# Patient Record
Sex: Female | Born: 1995 | Race: Black or African American | Hispanic: No | Marital: Single | Smoking: Never smoker
Health system: Southern US, Community
[De-identification: ages and names within clinical notes are randomized; demographics above are authoritative.]

## PROBLEM LIST (undated history)

## (undated) HISTORY — PX: TONSILLECTOMY: SUR1361

## (undated) HISTORY — PX: PLACEMENT OF BREAST IMPLANTS: SHX6334

## (undated) HISTORY — PX: DILATION AND CURETTAGE OF UTERUS: SHX78

## (undated) HISTORY — PX: INDUCED ABORTION: SHX677

## (undated) HISTORY — PX: APPENDECTOMY: SHX54

---
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
## 2017-05-25 IMAGING — US US PELVIS COMPLETE
1 series · 14 of 25 positions shown · non-contrast
Comparison: CT of the abdomen and pelvis from 09/16/2012

CLINICAL DATA: Acute onset of left pelvic pain.  Initial encounter.

EXAM:
TRANSABDOMINAL AND TRANSVAGINAL ULTRASOUND OF PELVIS
TECHNIQUE: Both transabdominal and transvaginal ultrasound examinations of the
pelvis were performed. Transabdominal technique was performed for
global imaging of the pelvis including uterus, ovaries, adnexal
regions, and pelvic cul-de-sac. It was necessary to proceed with
endovaginal exam following the transabdominal exam to visualize the
uterus and ovaries in greater detail.

[Series 1: us pelvis complete · 0.24mm/px · 14 of 61 slices shown]
[im 1/61]
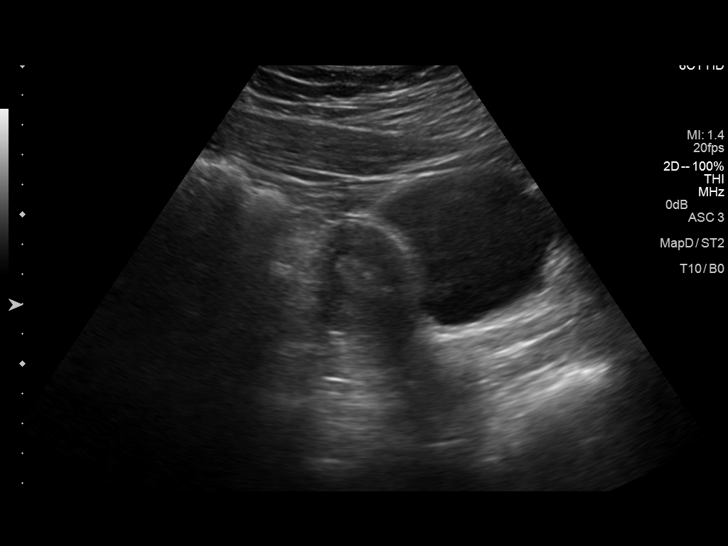
[im 6/61]
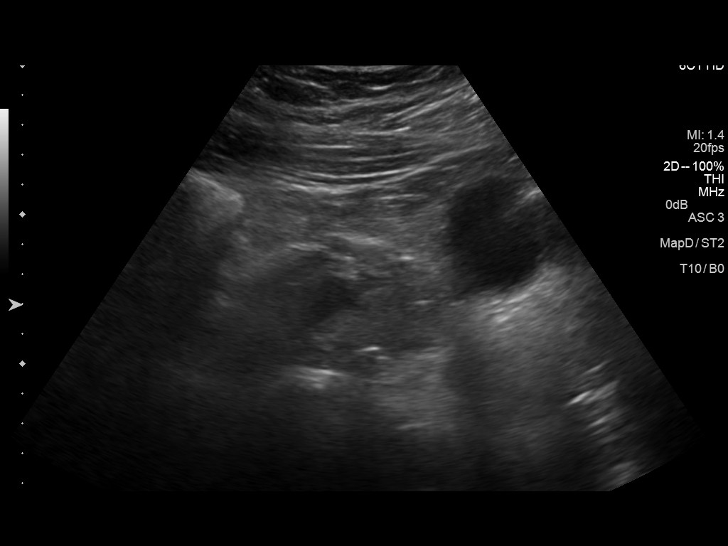
[im 11/61]
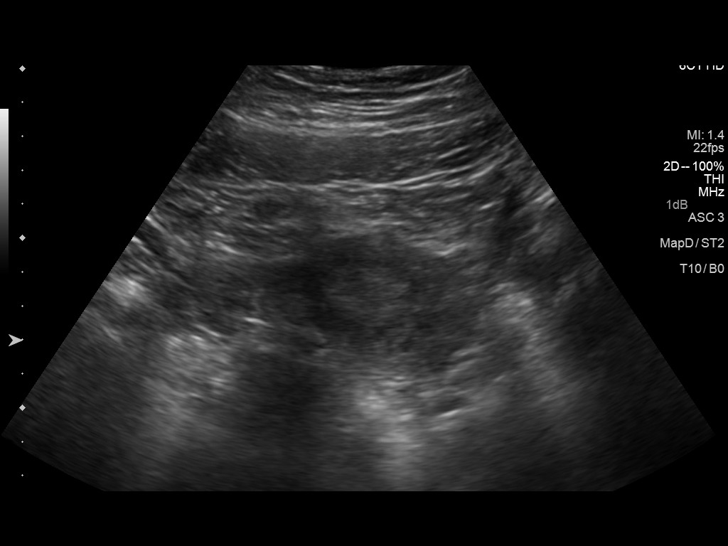
[im 16/61]
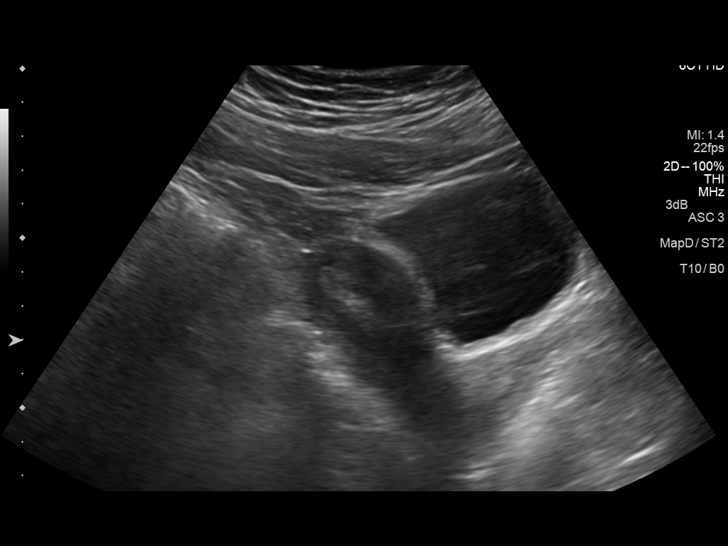
[im 21/61]
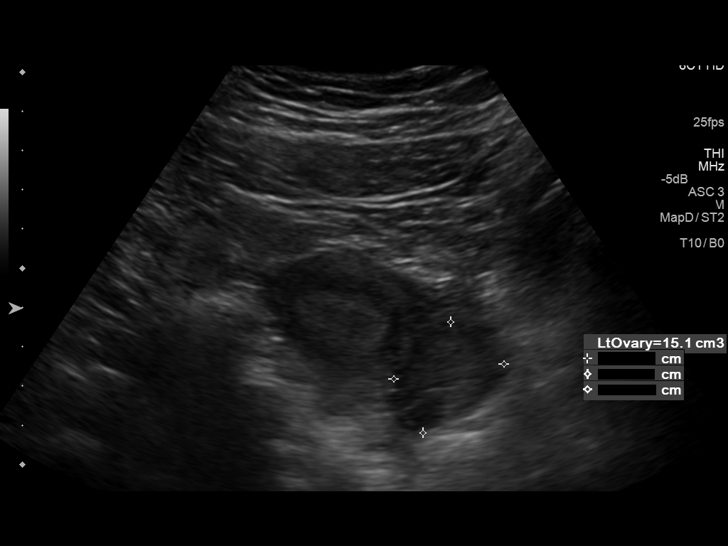
[im 23/61]
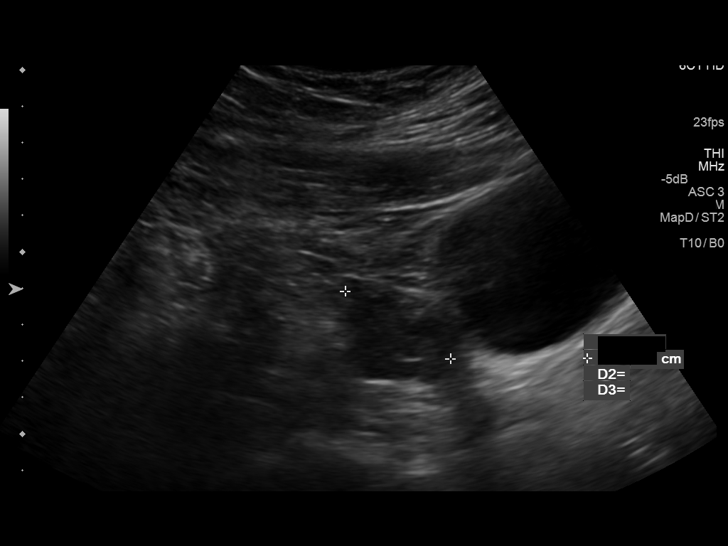
[im 28/61]
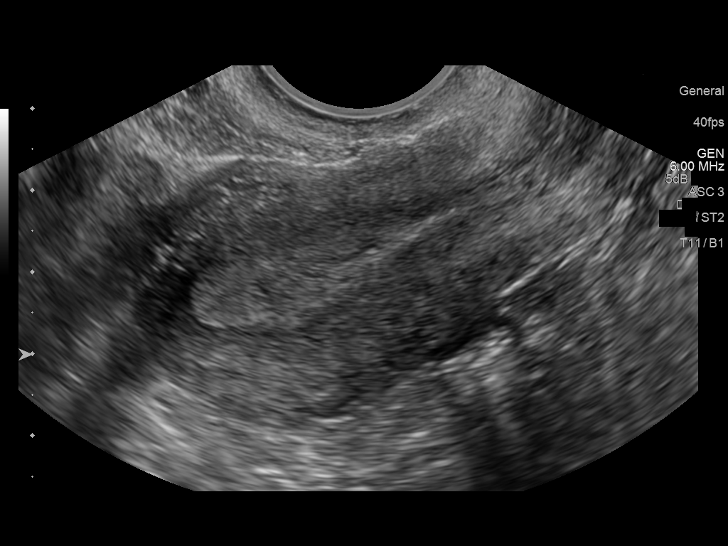
[im 33/61]
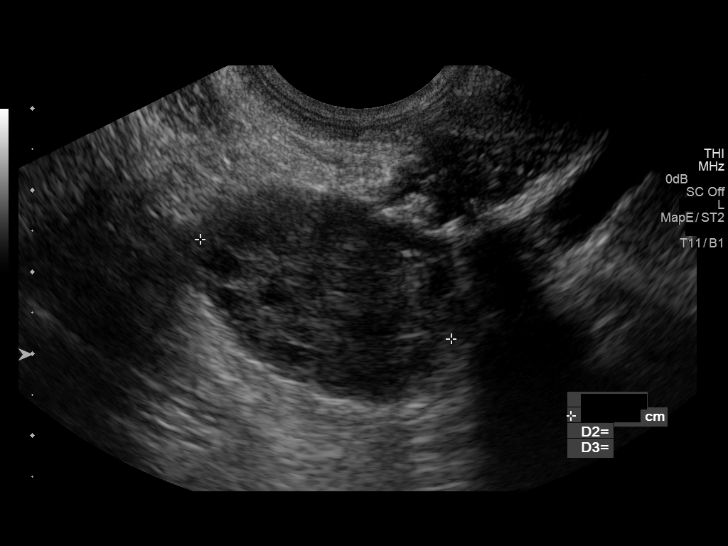
[im 38/61]
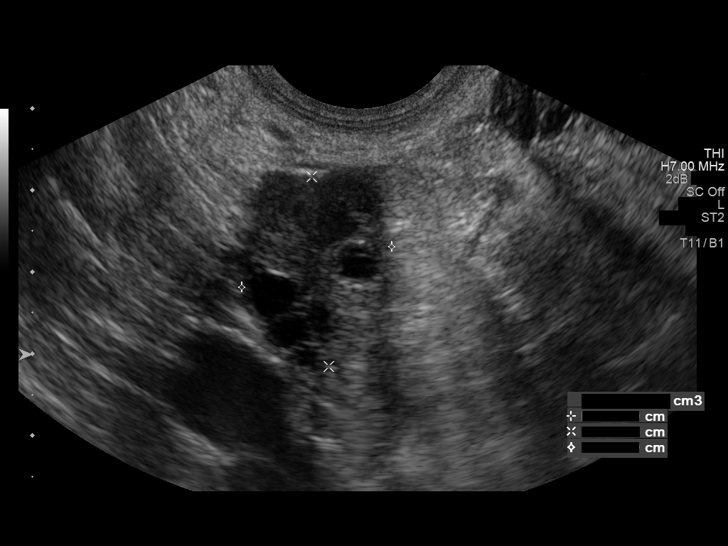
[im 41/61]
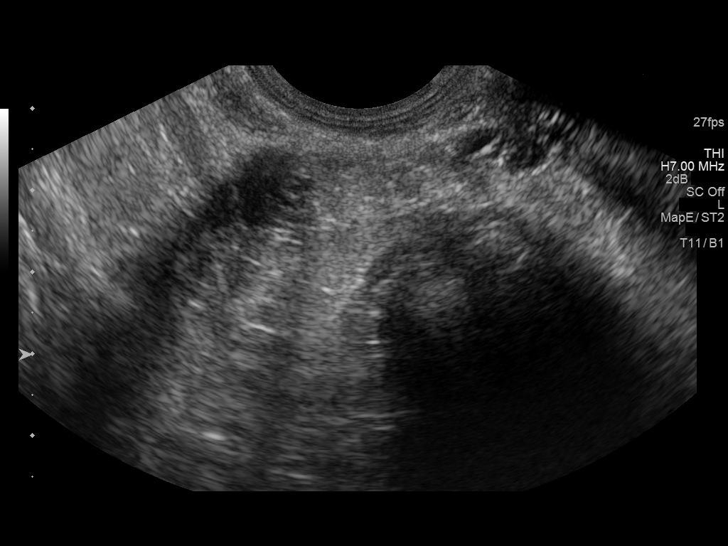
[im 46/61]
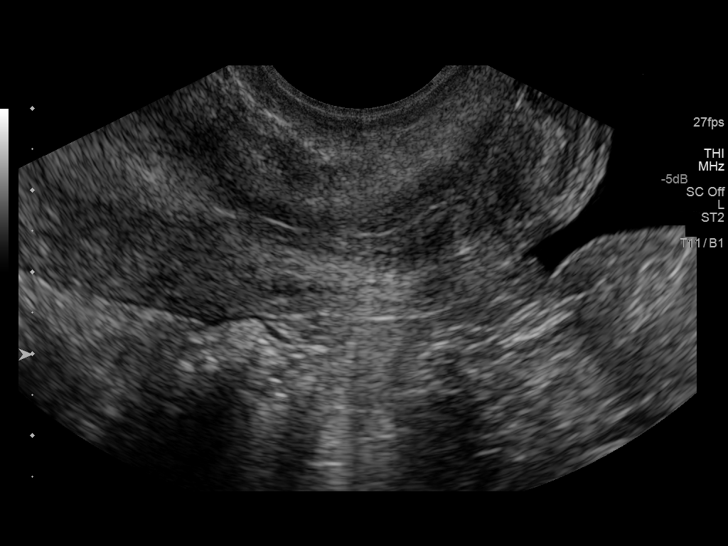
[im 51/61]
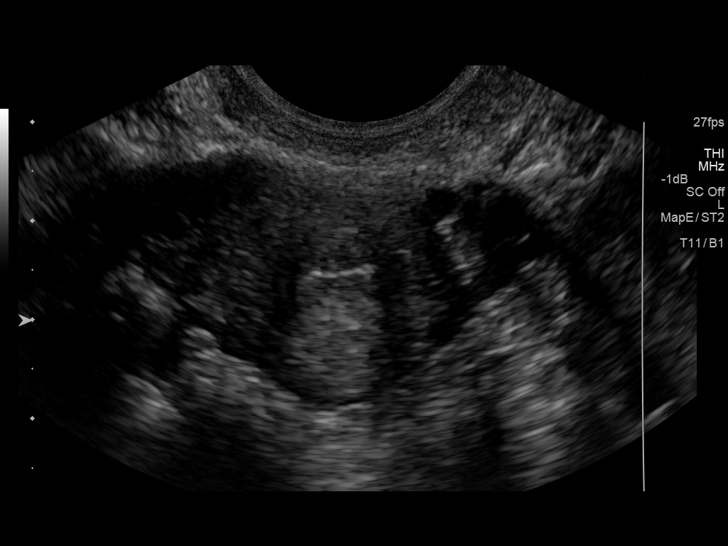
[im 56/61]
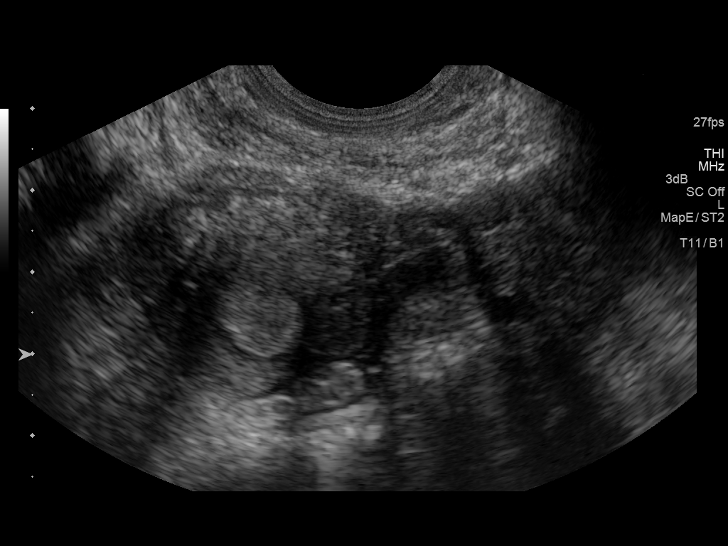
[im 61/61]
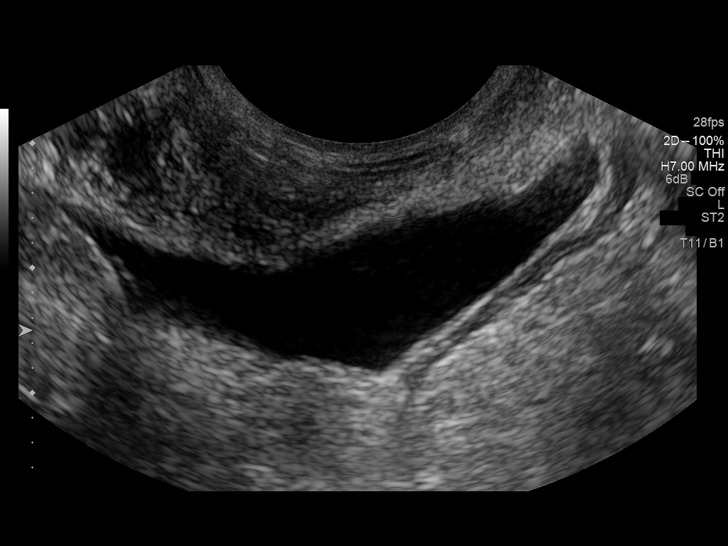

[14 of 25 positions shown; findings below may reference images not displayed]

FINDINGS: Uterus

Measurements: 6.2 x 3.4 x 4.2 cm. No fibroids or other mass
visualized.

Endometrium

Thickness: 1.3 cm.  No focal abnormality visualized.

Right ovary

Measurements: 3.4 x 2.3 x 2.7 cm. Normal appearance/no adnexal mass.

Left ovary

Measurements: 3.5 x 2.9 x 2.8 cm. Normal appearance/no adnexal mass.

Other findings

A small to moderate amount of free fluid is seen within the pelvic
cul-de-sac.
IMPRESSION: Unremarkable pelvic ultrasound.  No evidence for ovarian torsion.

## 2018-02-27 ENCOUNTER — Other Ambulatory Visit: Payer: Self-pay

## 2018-02-27 ENCOUNTER — Emergency Department
Admission: EM | Admit: 2018-02-27 | Discharge: 2018-02-28 | Disposition: A | Discharge status: Home / Self Care | Payer: Self-pay | Attending: Emergency Medicine | Admitting: Emergency Medicine

## 2018-02-27 ENCOUNTER — Encounter: Payer: Self-pay | Admitting: Emergency Medicine

## 2018-02-27 ENCOUNTER — Emergency Department: Payer: Self-pay

## 2018-02-27 DIAGNOSIS — S0512XA Contusion of eyeball and orbital tissues, left eye, initial encounter: Secondary | ICD-10-CM

## 2018-02-27 DIAGNOSIS — S0990XA Unspecified injury of head, initial encounter: Secondary | ICD-10-CM | POA: Insufficient documentation

## 2018-02-27 DIAGNOSIS — Y929 Unspecified place or not applicable: Secondary | ICD-10-CM | POA: Insufficient documentation

## 2018-02-27 DIAGNOSIS — S01112A Laceration without foreign body of left eyelid and periocular area, initial encounter: Secondary | ICD-10-CM | POA: Insufficient documentation

## 2018-02-27 DIAGNOSIS — W01198A Fall on same level from slipping, tripping and stumbling with subsequent striking against other object, initial encounter: Secondary | ICD-10-CM | POA: Insufficient documentation

## 2018-02-27 DIAGNOSIS — S0993XA Unspecified injury of face, initial encounter: Secondary | ICD-10-CM | POA: Insufficient documentation

## 2018-02-27 DIAGNOSIS — S0012XA Contusion of left eyelid and periocular area, initial encounter: Secondary | ICD-10-CM | POA: Insufficient documentation

## 2018-02-27 DIAGNOSIS — S0181XA Laceration without foreign body of other part of head, initial encounter: Secondary | ICD-10-CM

## 2018-02-27 DIAGNOSIS — Y9389 Activity, other specified: Secondary | ICD-10-CM | POA: Insufficient documentation

## 2018-02-27 DIAGNOSIS — Y998 Other external cause status: Secondary | ICD-10-CM | POA: Insufficient documentation

## 2018-02-27 MED ORDER — TRAMADOL HCL 50 MG PO TABS
50.0000 mg | ORAL_TABLET | Freq: Once | ORAL | Status: AC
  Administered 2018-02-28: 50 mg via ORAL
  Filled 2018-02-27: qty 1

## 2018-02-27 MED ORDER — BACITRACIN-NEOMYCIN-POLYMYXIN 400-5-5000 EX OINT
TOPICAL_OINTMENT | Freq: Once | CUTANEOUS | Status: AC
  Administered 2018-02-28: 1 via TOPICAL
  Filled 2018-02-27: qty 1

## 2018-02-27 MED ORDER — DIAZEPAM 5 MG PO TABS
5.0000 mg | ORAL_TABLET | Freq: Once | ORAL | Status: AC
  Administered 2018-02-27: 5 mg via ORAL
  Filled 2018-02-27: qty 1

## 2018-02-27 MED ORDER — LIDOCAINE-EPINEPHRINE (PF) 2 %-1:200000 IJ SOLN
30.0000 mL | Freq: Once | INTRAMUSCULAR | Status: DC
  Filled 2018-02-27: qty 30

## 2018-02-27 MED ORDER — TRAMADOL HCL 50 MG PO TABS: 50.0000 mg | ORAL_TABLET | Freq: Four times a day (QID) | ORAL | 0 refills | Status: AC | PRN

## 2018-02-27 MED ORDER — LIDOCAINE-EPINEPHRINE 2 %-1:100000 IJ SOLN
20.0000 mL | Freq: Once | INTRAMUSCULAR | Status: AC
  Administered 2018-02-27: 20 mL via INTRADERMAL

## 2018-02-27 NOTE — ED Notes (Signed)
Patient taken to imaging. 

## 2018-02-27 NOTE — Discharge Instructions (Addendum)
Do not get the sutured area wet for 24 hours. After 24 hours, shower/bathe as usual and pat the area dry. Change the bandage 2 times per day and apply antibiotic ointment. Leave open to air when at no risk of getting the area dirty, but cover at night before bed. See your PCP or go to Urgent Care in 5 days for suture removal or sooner for signs or concern of infection.  

## 2018-02-27 NOTE — ED Triage Notes (Signed)
Patient to ER for c/o facial lac to left eye brow. Patient states she tripped and fell, hitting hitch on truck. Approx 2 inch lac present to eye brow with mild breeding. Patient states she also knocked tooth back and busted lip.

## 2018-02-28 NOTE — ED Provider Notes (Signed)
[facility] Emergency Department Provider Note  ____________________________________________  Time seen: Approximately 12:03 AM  I have reviewed the triage vital signs and the nursing notes.   HISTORY  Chief Complaint Facial Laceration   HPI Erin White is a 23 y.o. female who presents to the emergency department for treatment and evaluation after slipping and falling. She landed face first on the hitch of the truck and now has a laceration to the left eyelid/forehead. She denies loss of consciousness, visual changes, nausea/vomiting, confusion, or headache. She did have a brief nosebleed on the left, but that stopped in just a couple of minutes. Tetanus is up to date.    History reviewed. No pertinent past medical history.  There are no active problems to display for this patient.   Past Surgical History:  Procedure Laterality Date  . DILATION AND CURETTAGE OF UTERUS    . INDUCED ABORTION     2//22/2020    Prior to Admission medications   Medication Sig Start Date End Date Taking? Authorizing Provider  traMADol (ULTRAM) 50 MG tablet Take 1 tablet (50 mg total) by mouth every 6 (six) hours as needed. 02/27/18   Chinita Pester, FNP    Allergies Patient has no known allergies.  No family history on file.  Social History Social History   Tobacco Use  . Smoking status: Never Smoker  . Smokeless tobacco: Never Used  Substance Use Topics  . Alcohol use: Not Currently  . Drug use: Not on file    Review of Systems  Constitutional: Negative for fever. Respiratory: Negative for cough or shortness of breath.  Musculoskeletal: Negative for myalgias Skin: Positive for laceration to the left eyelid/eyebrow. Neurological: Negative for numbness or paresthesias. ____________________________________________   PHYSICAL EXAM:  VITAL SIGNS: ED Triage Vitals  Enc Vitals Group     BP 02/27/18 2114 117/65     Pulse Rate 02/27/18 2114 87     Resp  02/27/18 2114 16     Temp 02/27/18 2114 98.6 F (37 C)     Temp Source 02/27/18 2114 Oral     SpO2 02/27/18 2114 100 %     Weight 02/27/18 2122 201 lb (91.2 kg)     Height 02/27/18 2122 4\' 10"  (1.473 m)     Head Circumference --      Peak Flow --      Pain Score 02/27/18 2122 0     Pain Loc --      Pain Edu? --      Excl. in GC? --      Constitutional: Uncomfortable appearing. Eyes: Conjunctivae are clear without discharge or drainage. No hyphema. Ecchymosis of the left eyelid present. Nose: No rhinorrhea noted. Mouth/Throat: Airway is patent. Left incisor somewhat loose, but feels stable. Neck: No stridor. Unrestricted range of motion observed. Cardiovascular: Capillary refill is <3 seconds.  Respiratory: Respirations are even and unlabored.. Musculoskeletal: Unrestricted range of motion observed. Neurologic: Awake, alert, and oriented x 4.  Skin:  5.5cm laceration to the left eyelid and brow area.  ____________________________________________   LABS (all labs ordered are listed, but only abnormal results are displayed)  Labs Reviewed - No data to display ____________________________________________  EKG  Not indicated. ____________________________________________  RADIOLOGY  Maxillofacial CT shows no acute facial bone fractures.  There is a nondisplaced fracture of the root of the left maxillary central incisor tooth. ____________________________________________   PROCEDURES  .Marland KitchenLaceration Repair Date/Time: 02/28/2018 12:17 AM Performed by: Chinita Pester, FNP Authorized  by: Chinita Pester, FNP   Consent:    Consent obtained:  Verbal   Consent given by:  Patient   Risks discussed:  Infection and poor cosmetic result Laceration details:    Location:  Face   Face location:  L upper eyelid   Extent:  Full thickness Repair type:    Repair type:  Complex Pre-procedure details:    Preparation:  Patient was prepped and draped in usual sterile  fashion Exploration:    Wound exploration: entire depth of wound probed and visualized     Wound extent: no foreign bodies/material noted, no muscle damage noted and no underlying fracture noted   Treatment:    Area cleansed with:  Betadine and saline   Irrigation solution:  Sterile saline   Irrigation method:  Syringe Subcutaneous repair:    Suture size:  5-0   Suture material:  Fast-absorbing gut   Suture technique:  Simple interrupted   Number of sutures:  4 Skin repair:    Repair method:  Sutures   Suture size:  6-0   Suture material:  Prolene   Suture technique:  Simple interrupted   Number of sutures:  10 Approximation:    Approximation:  Close Post-procedure details:    Dressing:  Antibiotic ointment   Patient tolerance of procedure:  Tolerated well, no immediate complications   ____________________________________________   INITIAL IMPRESSION / ASSESSMENT AND PLAN / ED COURSE  Erin White is a 23 y.o. female presents to the emergency department for treatment and evaluation after falling forward and striking her face on the trailer hitch of the truck.  She did not experience any loss of consciousness.  She states that initially she felt like her front tooth was loose, but states that it now feels like it is back in position.  CT does show damage to the nerve root and the patient was advised that she will need to follow-up with a dentist.  She states that she plans to call tomorrow for an appointment.  Laceration involving the left eyelid and the medial corner of the left eyebrow was reapproximated as described above.  Cosmetic result appeared to be satisfactory.  She was provided wound care instructions.  She was encouraged to have the sutures removed in 5 days and she states that she plans to see [facility] clinic for removal.  She was advised to use Mederma or vitamin E over the area to help with scarring.  She was encouraged to use sunscreen over the area to prevent  sunburn.   Patient was encouraged to return to the emergency department for symptoms of concern if she is unable to schedule an appointment with her primary care provider or see someone at [facility] clinic.   Medications  diazepam (VALIUM) tablet 5 mg (5 mg Oral Given 02/27/18 2223)  lidocaine-EPINEPHrine (XYLOCAINE W/EPI) 2 %-1:100000 (with pres) injection 20 mL (20 mLs Intradermal Given 02/27/18 2334)  neomycin-bacitracin-polymyxin (NEOSPORIN) ointment packet (1 application Topical Given 02/28/18 0004)  traMADol (ULTRAM) tablet 50 mg (50 mg Oral Given 02/28/18 0000)     Pertinent labs & imaging results that were available during my care of the patient were reviewed by me and considered in my medical decision making (see chart for details).  ____________________________________________   FINAL CLINICAL IMPRESSION(S) / ED DIAGNOSES  Final diagnoses:  Facial laceration, initial encounter  Injury of head, initial encounter  Eye contusion, left, initial encounter  Dental trauma, initial encounter    ED Discharge Orders  Ordered    traMADol (ULTRAM) 50 MG tablet  Every 6 hours PRN     02/27/18 2357           Note:  This document was prepared using Dragon voice recognition software and may include unintentional dictation errors.    Chinita Pester, FNP 02/28/18 0026    Jeanmarie Plant, MD 03/02/18 [phone]

## 2019-11-08 ENCOUNTER — Ambulatory Visit: Payer: Self-pay

## 2019-11-08 ENCOUNTER — Ambulatory Visit (LOCAL_COMMUNITY_HEALTH_CENTER): Payer: BLUE CROSS/BLUE SHIELD | Admitting: Advanced Practice Midwife

## 2019-11-08 ENCOUNTER — Encounter: Payer: Self-pay | Admitting: Advanced Practice Midwife

## 2019-11-08 ENCOUNTER — Other Ambulatory Visit: Payer: Self-pay

## 2019-11-08 VITALS — BP 115/66 | Ht <= 58 in | Wt 235.0 lb

## 2019-11-08 DIAGNOSIS — Z3201 Encounter for pregnancy test, result positive: Secondary | ICD-10-CM

## 2019-11-08 DIAGNOSIS — Z3200 Encounter for pregnancy test, result unknown: Secondary | ICD-10-CM

## 2019-11-08 DIAGNOSIS — Z3009 Encounter for other general counseling and advice on contraception: Secondary | ICD-10-CM

## 2019-11-08 LAB — PREGNANCY, URINE: Preg Test, Ur: POSITIVE — AB

## 2019-11-08 MED ORDER — PRENATAL VITAMIN 27-0.8 MG PO TABS: 1.0000 | ORAL_TABLET | Freq: Every day | ORAL | 0 refills | Status: AC

## 2019-11-08 NOTE — Progress Notes (Signed)
Pt with positive UPT. Positive UPT packet, immunization record, proof of pregnancy, and PNV's given to pt. Pt reports no issues with pregnancy so far. Pt states unsure of where she would like to go for prenatal care and declines pre-admit today. Pt aware of WIC services and prenatal medicaid availability if needed.

## 2019-12-09 ENCOUNTER — Other Ambulatory Visit: Payer: Self-pay

## 2019-12-09 ENCOUNTER — Ambulatory Visit: Payer: BLUE CROSS/BLUE SHIELD | Admitting: Obstetrics

## 2019-12-09 ENCOUNTER — Encounter: Payer: Self-pay | Admitting: Obstetrics

## 2019-12-09 VITALS — BP 108/70 | Wt 234.0 lb

## 2019-12-09 DIAGNOSIS — Z3480 Encounter for supervision of other normal pregnancy, unspecified trimester: Secondary | ICD-10-CM | POA: Insufficient documentation

## 2019-12-09 NOTE — Progress Notes (Unsigned)
C/O [email or web address] 

## 2020-06-22 DIAGNOSIS — Z98891 History of uterine scar from previous surgery: Secondary | ICD-10-CM

## 2020-06-22 HISTORY — DX: History of uterine scar from previous surgery: Z98.891

## 2020-07-06 ENCOUNTER — Ambulatory Visit
Admission: EM | Admit: 2020-07-06 | Discharge: 2020-07-06 | Disposition: A | Discharge status: Home / Self Care | Payer: Medicaid Other | Attending: Sports Medicine | Admitting: Sports Medicine

## 2020-07-06 ENCOUNTER — Other Ambulatory Visit: Payer: Self-pay

## 2020-07-06 DIAGNOSIS — O900 Disruption of cesarean delivery wound: Secondary | ICD-10-CM | POA: Diagnosis not present

## 2020-07-06 DIAGNOSIS — R509 Fever, unspecified: Secondary | ICD-10-CM

## 2020-07-06 MED ORDER — AMOXICILLIN-POT CLAVULANATE 875-125 MG PO TABS: 1.0000 | ORAL_TABLET | Freq: Two times a day (BID) | ORAL | 0 refills | Status: DC

## 2020-07-06 NOTE — Discharge Instructions (Addendum)
As we discussed, your incision looks great except for a small opening on the left side of your abdomen.  It is draining normal serosanguineous fluid.  There is no abscess or active infection.  Your abdomen is soft. However, given that you are having chills and your current temperature is 100.5, I am electing to treat you with antibiotics.  Please take as directed. I want you to call your OB when you leave here.  You will get the doctor on-call.  I want you to advise them of your symptoms, and that you were seen in the urgent care.  Let them know that you had a temperature of 100.5 and that they put you on antibiotics.  Also, let them know that the doctor would like you to be seen by OB on Tuesday, July 5 for a follow-up appointment. If your symptoms worsen between your follow-up appointment and now, I want you to go to the emergency room and I recommend [facility] as you gave birth in that hospital and would assist with continuity of care. Please see educational handouts. Use Tylenol only as you are breast-feeding - for any fever or discomfort.

## 2020-07-06 NOTE — ED Provider Notes (Signed)
[facility]    CSN: [id number] Arrival date & time: 07/06/20  1531      History   Chief Complaint Chief Complaint  Patient presents with   Incisional Pain    C-section 06/22/20    HPI Erin White is a 25 y.o. female.   Patient is a 25 year old female who presents for evaluation of concern about discharge from an abdominal incision from her recent cesarean section.  She gave birth on June 22, 2020.  She is 14 days post cesarean section.  Baby is doing fine.  [facility].  I have reviewed her medical record in detail.  They have attempted to contact her on 2 occasions and have sent a letter regarding her follow-up.  According to the medical record they were unable to make any contact with her so far.  She did not call them prior to coming to the urgent care.  Her OB/GYN is Dr. Alben SpittleWeaver.  She has a family friend that she reports is her midwife that has been looking at her wound.  There was concern about possible infection.  They have been taping the wound.  It has been oozing a yellowish substance.  She reports no documented fevers although she has had some chills.  She denies any abdominal pain or changes in urinary or bowel habits.  No dysuria or hematuria.  No nausea vomiting or diarrhea.   Past Medical History:  Diagnosis Date   History of C-section 06/22/2020    Patient Active Problem List   Diagnosis Date Noted   Supervision of other normal pregnancy, antepartum 12/09/2019    Past Surgical History:  Procedure Laterality Date   APPENDECTOMY     DILATION AND CURETTAGE OF UTERUS     INDUCED ABORTION     2//22/2020   PLACEMENT OF BREAST IMPLANTS     left side, ~2016 per pt   TONSILLECTOMY      OB History     Gravida  2   Para      Term      Preterm      AB  1   Living         SAB      IAB      Ectopic      Multiple      Live Births               Home Medications    Prior to Admission  medications   Medication Sig Start Date End Date Taking? Authorizing Provider  amoxicillin-clavulanate (AUGMENTIN) 875-125 MG tablet Take 1 tablet by mouth every 12 (twelve) hours. 07/06/20  Yes Delton SeeBarnes, Karlton Maya, MD  Prenatal Vit-Fe Fumarate-FA (PRENATAL VITAMIN) 27-0.8 MG TABS Take 1 tablet by mouth daily. 11/08/19  Yes Sciora, Austin MilesElizabeth A, CNM  traMADol (ULTRAM) 50 MG tablet Take 1 tablet (50 mg total) by mouth every 6 (six) hours as needed. Patient not taking: Reported on 11/08/2019 02/27/18   Chinita Pesterriplett, Cari B, FNP    Family History Family History  Problem Relation Age of Onset   Diabetes Maternal Aunt    Hypertension Maternal Aunt    Breast cancer Maternal Aunt    Hypertension Paternal Uncle     Social History Social History   Tobacco Use   Smoking status: Never   Smokeless tobacco: Never  Vaping Use   Vaping Use: Never used  Substance Use Topics   Alcohol use: Not Currently   Drug  use: Never     Allergies   Other   Review of Systems Review of Systems  Constitutional:  Positive for chills and fever. Negative for activity change, appetite change, diaphoresis and fatigue.  HENT:  Negative for congestion, ear pain, postnasal drip, rhinorrhea, sinus pressure, sinus pain, sneezing and sore throat.   Eyes:  Negative for pain.  Respiratory:  Negative for cough, chest tightness and shortness of breath.   Cardiovascular:  Negative for chest pain and palpitations.  Gastrointestinal:  Negative for abdominal pain, diarrhea, nausea and vomiting.  Genitourinary:  Negative for dysuria, flank pain, hematuria, pelvic pain, urgency, vaginal bleeding, vaginal discharge and vaginal pain.  Musculoskeletal:  Negative for back pain, myalgias and neck pain.  Skin:  Positive for wound. Negative for color change, pallor and rash.  Neurological:  Negative for dizziness, light-headedness and headaches.  All other systems reviewed and are negative.   Physical Exam Triage Vital Signs ED Triage  Vitals [07/06/20 1559]  Enc Vitals Group     BP      Pulse      Resp      Temp      Temp src      SpO2      Weight      Height      Head Circumference      Peak Flow      Pain Score 4     Pain Loc      Pain Edu?      Excl. in [place]?    No data found.  Updated Vital Signs BP 115/76 (BP Location: Left Arm)   Pulse (!) 109   Temp (!) 100.5 F (38.1 C) (Oral)   Resp 18   Ht 4\' 10"  (1.473 m)   Wt 81.6 kg   LMP 09/14/2019 (Exact Date) Comment: normal period per pt  SpO2 100%   Breastfeeding Yes   BMI 37.62 kg/m   Visual Acuity Right Eye Distance:   Left Eye Distance:   Bilateral Distance:    Right Eye Near:   Left Eye Near:    Bilateral Near:     Physical Exam Vitals and nursing note reviewed.  Constitutional:      General: She is not in acute distress.    Appearance: Normal appearance. She is not ill-appearing, toxic-appearing or diaphoretic.  HENT:     Head: Normocephalic and atraumatic.     Nose: Nose normal.     Mouth/Throat:     Mouth: Mucous membranes are moist.  Eyes:     General: No scleral icterus.    Conjunctiva/sclera: Conjunctivae normal.     Pupils: Pupils are equal, round, and reactive to light.  Cardiovascular:     Rate and Rhythm: Normal rate and regular rhythm.     Pulses: Normal pulses.     Heart sounds: Normal heart sounds. No murmur heard.   No friction rub. No gallop.  Pulmonary:     Effort: Pulmonary effort is normal.     Breath sounds: Normal breath sounds. No stridor. No wheezing, rhonchi or rales.  Abdominal:     General: Abdomen is protuberant. A surgical scar is present. Bowel sounds are normal. There are no signs of injury.     Palpations: Abdomen is soft. There is no shifting dullness, fluid wave, hepatomegaly, splenomegaly or mass.     Tenderness: There is no abdominal tenderness. There is no right CVA tenderness, left CVA tenderness, guarding or rebound.     Comments: Tiny  opening on the left side of her abdomen at the end of  the surgical incision.  It is draining some serosanguineous fluid.  There is no pus drainage.  No real tenderness around the area.  No erythema.  No active infection.  The abdomen is soft and nontender.  Musculoskeletal:     Cervical back: Normal range of motion and neck supple.  Skin:    General: Skin is warm and dry.     Capillary Refill: Capillary refill takes less than 2 seconds.  Neurological:     General: No focal deficit present.     Mental Status: She is alert and oriented to person, place, and time.     UC Treatments / Results  Labs (all labs ordered are listed, but only abnormal results are displayed) Labs Reviewed - No data to display  EKG   Radiology No results found.  Procedures Procedures (including critical care time)  Medications Ordered in UC Medications - No data to display  Initial Impression / Assessment and Plan / UC Course  I have reviewed the triage vital signs and the nursing notes.  Pertinent labs & imaging results that were available during my care of the patient were reviewed by me and considered in my medical decision making (see chart for details).  Clinical impression: 1.  Tiny wound dehiscence on the left side of a cesarean section incision that is 37 weeks old. 2.  Febrile illness with current temperature of 100.5 with associated chills. 3.  Benign abdomen without abscess formation.  Treatment plan: 1.  The findings and treatment plan were discussed in detail with the patient.  Patient was in agreement. 2.  Had a long discussion with the patient and reassured her that there was no active infection or abscess formation.  I advised her that she needed to contact her OB/GYN as soon as she leaves here and update them on the situation.  I did indicate to her that I had concerned because her current temperature was 100.5 so I elected to treat her with antibiotics.  Side effects of the antibiotic were explained in detail. 3.  Educational handouts  provided. 4.  I advised her to get into see her OB/GYN tomorrow July 5 or the next day July 6. 5.  If symptoms persist or worsen I told her that she needed to go to [facility] emergency department and she voiced verbal understanding. 6.  Tylenol for fever or discomfort.  She is breast-feeding so it is Tylenol only. 7.  Advised of the side effects of Augmentin with the baby in the breast-feeding.  She reports the baby is constipated so she is not too concerned about the diarrhea side effects. 8.  She was discharged in stable condition and will follow-up here as needed.    Final Clinical Impressions(s) / UC Diagnoses   Final diagnoses:  Wound dehiscence, cesarean, postpartum condition  Febrile illness     Discharge Instructions      As we discussed, your incision looks great except for a small opening on the left side of your abdomen.  It is draining normal serosanguineous fluid.  There is no abscess or active infection.  Your abdomen is soft. However, given that you are having chills and your current temperature is 100.5, I am electing to treat you with antibiotics.  Please take as directed. I want you to call your OB when you leave here.  You will get the doctor on-call.  I want you to advise  them of your symptoms, and that you were seen in the urgent care.  Let them know that you had a temperature of 100.5 and that they put you on antibiotics.  Also, let them know that the doctor would like you to be seen by OB on Tuesday, July 5 for a follow-up appointment. If your symptoms worsen between your follow-up appointment and now, I want you to go to the emergency room and I recommend [facility] as you gave birth in that hospital and would assist with continuity of care. Please see educational handouts. Use Tylenol only as you are breast-feeding - for any fever or discomfort.     ED Prescriptions     Medication Sig Dispense Auth. Provider   amoxicillin-clavulanate (AUGMENTIN)  875-125 MG tablet Take 1 tablet by mouth every 12 (twelve) hours. 14 tablet Delton See, MD      PDMP not reviewed this encounter.   Delton See, MD 07/06/20 1630

## 2020-07-06 NOTE — ED Triage Notes (Signed)
Pt c/o recent c-section and is concerned about possible infection or complication. Pt states area was taped together and she removed this last week herself. Pt reports there has been an open area that has been oozing a light yellow substance. Pt states the area does seem to be closing more now. Pt denies f/n/v/d or severe abd pain.

## 2021-01-23 IMAGING — CT CT MAXILLOFACIAL W/O CM
3 series · 16 of 47 positions shown, 19 images · non-contrast
Comparison: None.

CLINICAL DATA: 22-year-old female with maxillofacial trauma.

EXAM:
CT MAXILLOFACIAL WITHOUT CONTRAST
TECHNIQUE: Multidetector CT imaging of the maxillofacial structures was
performed. Multiplanar CT image reconstructions were also generated.

[Series 2: max soft · axial · 0.31mm/px · z∈[+255,+381]mm · 10 of 73 slices shown, 13 images]
[im 5/73  brain]
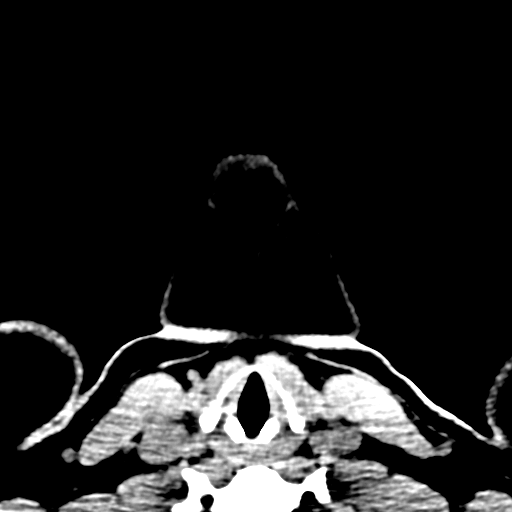
[im 5/73  bone]
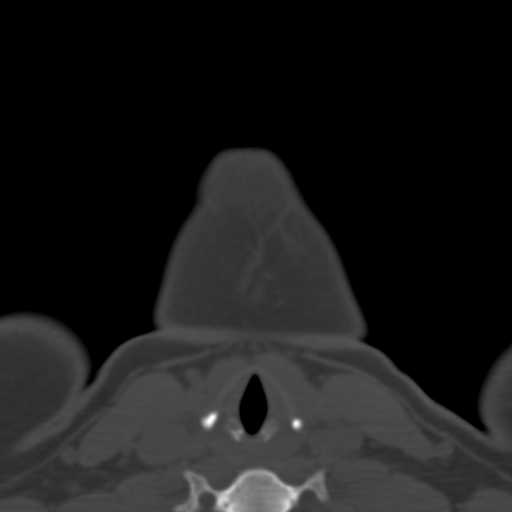
[im 13/73  bone]
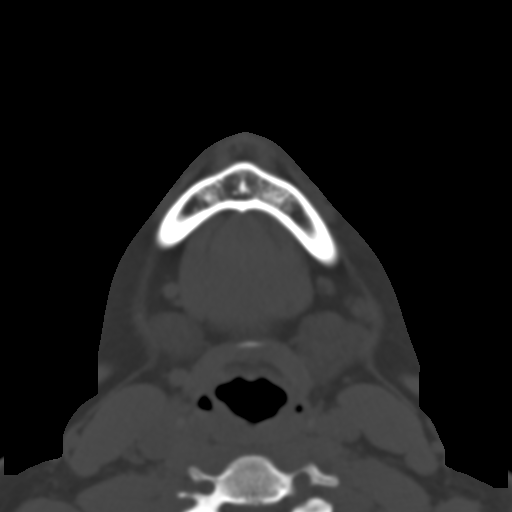
[im 20/73  bone]
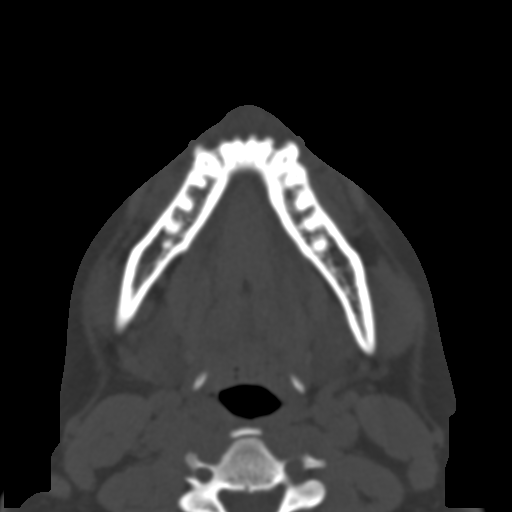
[im 25/73  bone]
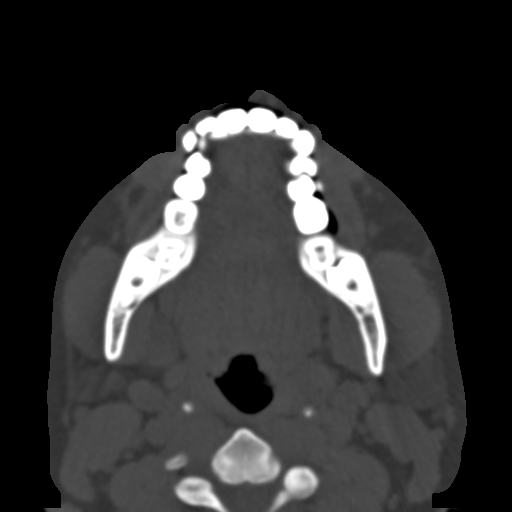
[im 33/73  brain]
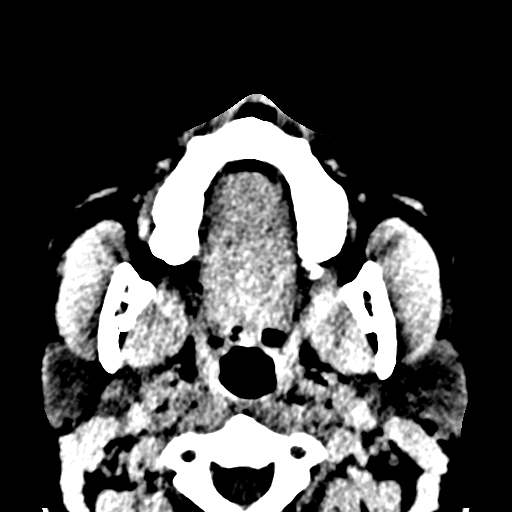
[im 33/73  bone]
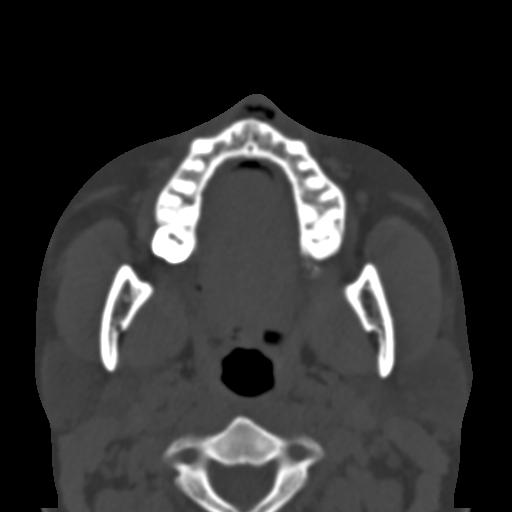
[im 40/73  bone]
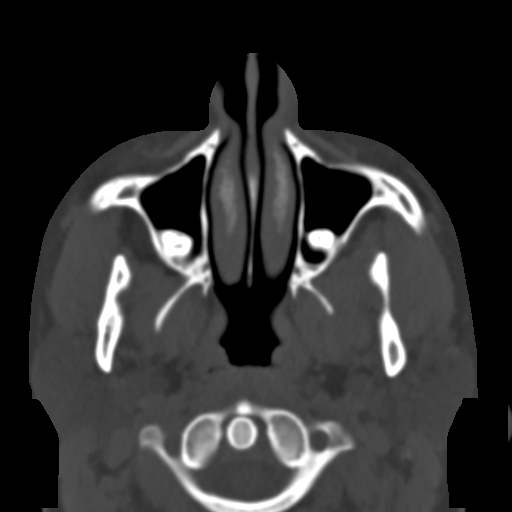
[im 48/73  bone]
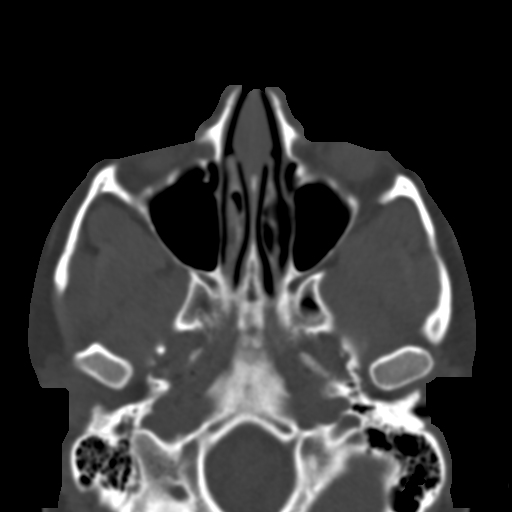
[im 55/73  bone]
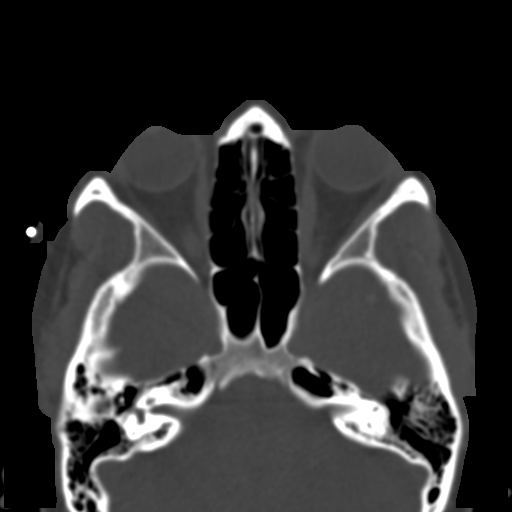
[im 60/73  brain]
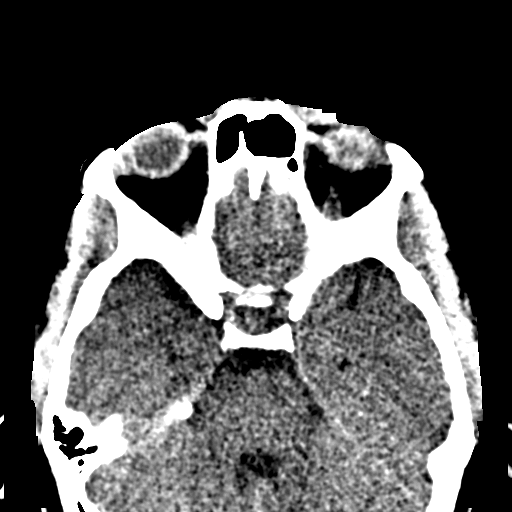
[im 60/73  bone]
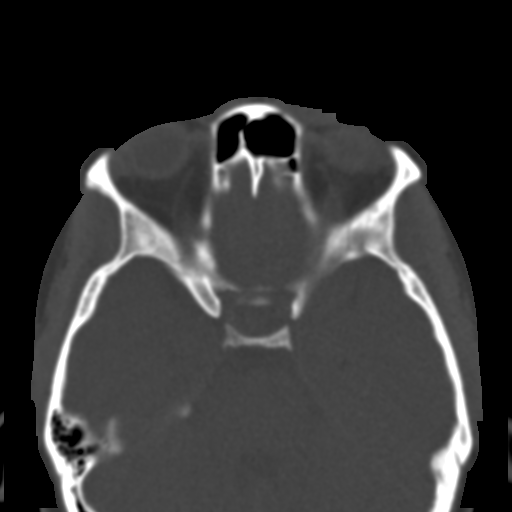
[im 68/73  bone]
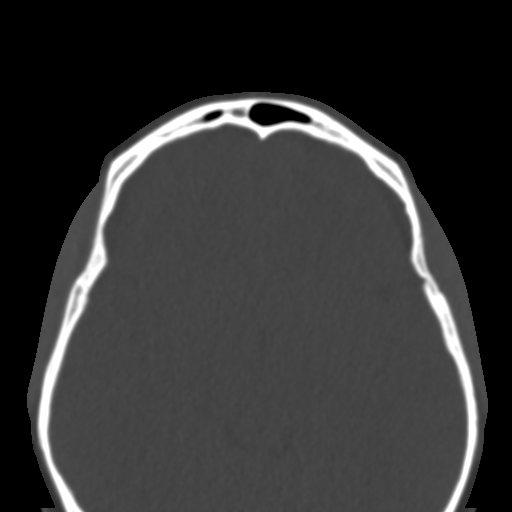

[Series 6: coronal soft · coronal · 0.31mm/px · 3 of 61 slices shown]
[im 21/61  bone]
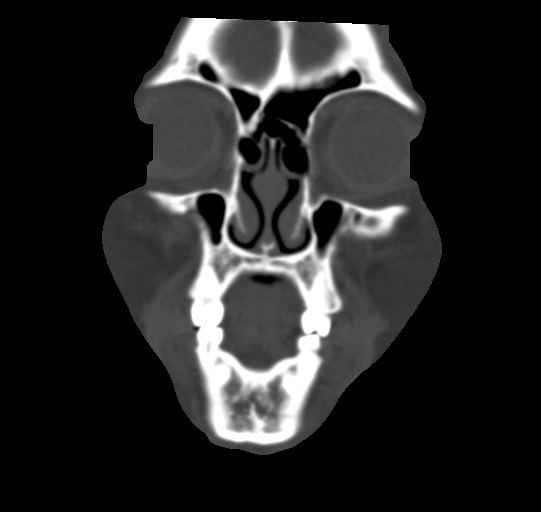
[im 27/61  bone]
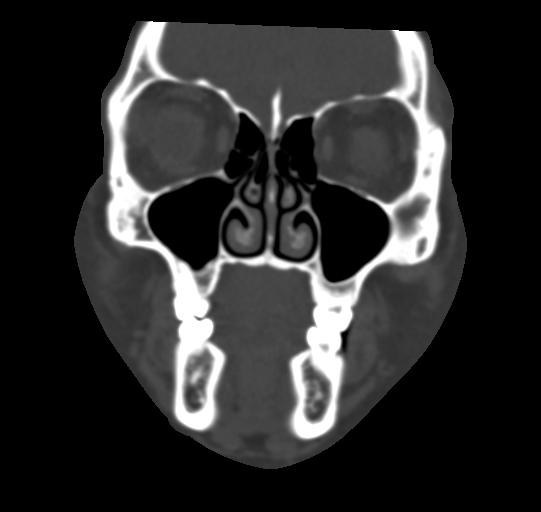
[im 34/61  bone]
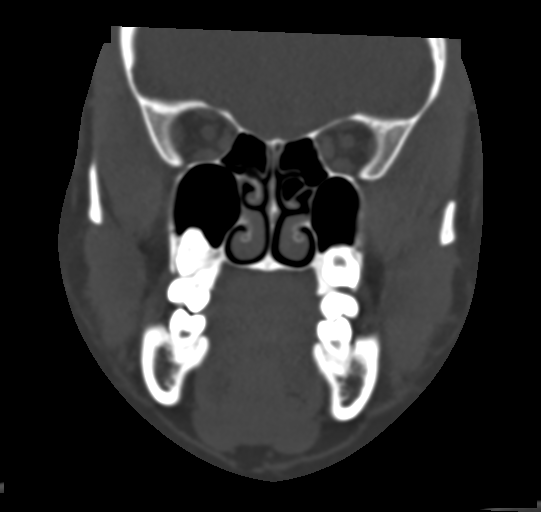

[Series 7: sagittal soft · sagittal · 0.29mm/px · 3 of 78 slices shown]
[im 26/78  bone]
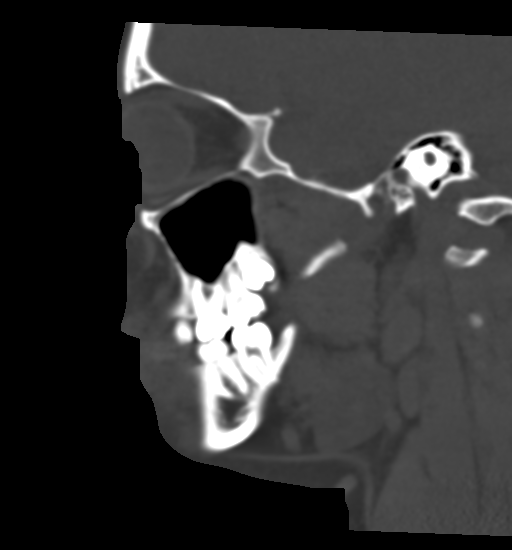
[im 39/78  bone]
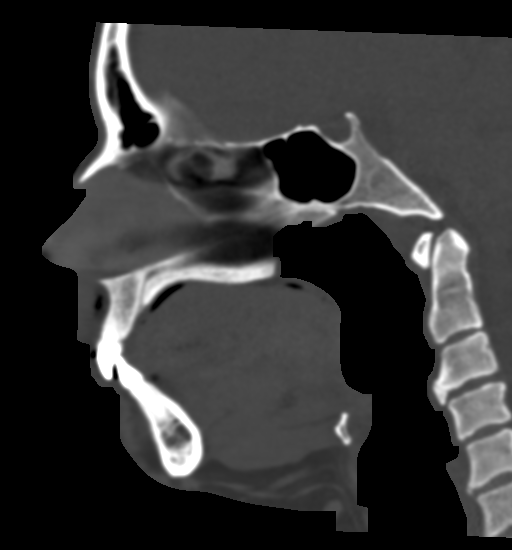
[im 52/78  bone]
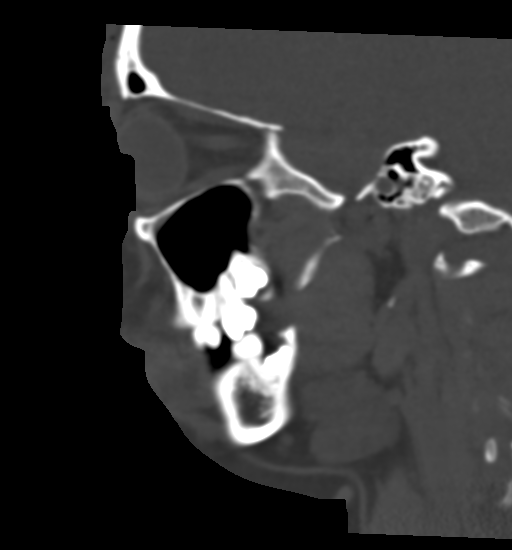

[16 of 47 positions shown; findings below may reference images not displayed]

FINDINGS: Osseous: No acute facial bone fractures. No mandibular subluxation.
There is a nondisplaced fracture of the root of the left maxillary
central incisor tooth.

Orbits: Negative. No traumatic or inflammatory finding.

Sinuses: Clear.

Soft tissues: Left forehead laceration and small hematoma.

Limited intracranial: No significant or unexpected finding.
IMPRESSION: 1. No acute facial bone fractures.
2. Nondisplaced fracture of the root of the left maxillary central
incisor tooth.
3. Left forehead laceration and small hematoma.

## 2021-03-02 ENCOUNTER — Ambulatory Visit
Admission: EM | Admit: 2021-03-02 | Discharge: 2021-03-02 | Disposition: A | Discharge status: Home / Self Care | Payer: Medicaid Other | Attending: Physician Assistant | Admitting: Physician Assistant

## 2021-03-02 ENCOUNTER — Other Ambulatory Visit: Payer: Self-pay

## 2021-03-02 DIAGNOSIS — R0981 Nasal congestion: Secondary | ICD-10-CM

## 2021-03-02 DIAGNOSIS — R051 Acute cough: Secondary | ICD-10-CM

## 2021-03-02 DIAGNOSIS — J069 Acute upper respiratory infection, unspecified: Secondary | ICD-10-CM

## 2021-03-02 MED ORDER — PROMETHAZINE-DM 6.25-15 MG/5ML PO SYRP: 5.0000 mL | ORAL_SOLUTION | Freq: Four times a day (QID) | ORAL | 0 refills | Status: AC | PRN

## 2021-03-02 MED ORDER — BENZONATATE 200 MG PO CAPS: 200.0000 mg | ORAL_CAPSULE | Freq: Three times a day (TID) | ORAL | 0 refills | Status: AC | PRN

## 2021-03-02 NOTE — ED Provider Notes (Addendum)
[facility]    CSN: [id number] Arrival date & time: 03/02/21  1057      History   Chief Complaint Chief Complaint  Patient presents with   Cough   Nasal Congestion    HPI Erin White is a 26 y.o. female presenting for about 1 week history of cough and congestion.  Patient reports temps up to 102 degrees 3 days ago.  No fever in the past 3 days.  She has been taking TheraFlu.  She says she brought her son and on 02/23/2020 for evaluation and he was found to have viral illness.  She believes she may have the same thing.  He was negative for COVID and flu.  Patient reports despite taking cough medication she is not getting any better and she feels like she is getting worse.  Does not report any breathing trouble, vomiting or diarrhea.  Patient does report having a baby recently.  She says the baby is formula fed and she is not resting.  No other complaints.  HPI  Past Medical History:  Diagnosis Date   History of C-section 06/22/2020    Patient Active Problem List   Diagnosis Date Noted   Supervision of other normal pregnancy, antepartum 12/09/2019    Past Surgical History:  Procedure Laterality Date   APPENDECTOMY     DILATION AND CURETTAGE OF UTERUS     INDUCED ABORTION     2//22/2020   PLACEMENT OF BREAST IMPLANTS     left side, ~2016 per pt   TONSILLECTOMY      OB History     Gravida  2   Para      Term      Preterm      AB  1   Living         SAB      IAB      Ectopic      Multiple      Live Births               Home Medications    Prior to Admission medications   Medication Sig Start Date End Date Taking? Authorizing Provider  benzonatate (TESSALON) 200 MG capsule Take 1 capsule (200 mg total) by mouth 3 (three) times daily as needed for cough. 03/02/21  Yes Danton Clap, PA-C  promethazine-dextromethorphan (PROMETHAZINE-DM) 6.25-15 MG/5ML syrup Take 5 mLs by mouth 4 (four) times daily as needed. 03/02/21  Yes Danton Clap, PA-C  Prenatal Vit-Fe Fumarate-FA (PRENATAL VITAMIN) 27-0.8 MG TABS Take 1 tablet by mouth daily. 11/08/19   Sciora, Real Cons, CNM  traMADol (ULTRAM) 50 MG tablet Take 1 tablet (50 mg total) by mouth every 6 (six) hours as needed. Patient not taking: Reported on 11/08/2019 02/27/18   Victorino Dike, FNP    Family History Family History  Problem Relation Age of Onset   Diabetes Maternal Aunt    Hypertension Maternal Aunt    Breast cancer Maternal Aunt    Hypertension Paternal Uncle     Social History Social History   Tobacco Use   Smoking status: Never   Smokeless tobacco: Never  Vaping Use   Vaping Use: Never used  Substance Use Topics   Alcohol use: Not Currently   Drug use: Never     Allergies   Other   Review of Systems Review of Systems  Constitutional:  Negative for chills, diaphoresis, fatigue and fever.  HENT:  Positive for congestion and rhinorrhea. Negative  for ear pain, sinus pressure, sinus pain and sore throat.   Respiratory:  Positive for cough. Negative for shortness of breath.   Gastrointestinal:  Negative for abdominal pain, nausea and vomiting.  Musculoskeletal:  Negative for arthralgias and myalgias.  Skin:  Negative for rash.  Neurological:  Negative for weakness and headaches.  Hematological:  Negative for adenopathy.    Physical Exam Triage Vital Signs ED Triage Vitals  Enc Vitals Group     BP 03/02/21 1146 108/74     Pulse Rate 03/02/21 1146 87     Resp 03/02/21 1146 18     Temp 03/02/21 1146 98.8 F (37.1 C)     Temp Source 03/02/21 1146 Oral     SpO2 03/02/21 1146 96 %     Weight 03/02/21 1143 230 lb (104.3 kg)     Height 03/02/21 1143 4\' 10"  (1.473 m)     Head Circumference --      Peak Flow --      Pain Score 03/02/21 1143 3     Pain Loc --      Pain Edu? --      Excl. in [place]? --    No data found.  Updated Vital Signs BP 108/74 (BP Location: Left Arm)    Pulse 87    Temp 98.8 F (37.1 C) (Oral)    Resp 18    Ht  4\' 10"  (1.473 m)    Wt 230 lb (104.3 kg)    LMP 02/12/2021    SpO2 96%    BMI 48.07 kg/m      Physical Exam Vitals and nursing note reviewed.  Constitutional:      General: She is not in acute distress.    Appearance: Normal appearance. She is ill-appearing. She is not toxic-appearing.  HENT:     Head: Normocephalic and atraumatic.     Nose: Congestion present.     Mouth/Throat:     Mouth: Mucous membranes are moist.     Pharynx: Oropharynx is clear.  Eyes:     General: No scleral icterus.       Right eye: No discharge.        Left eye: No discharge.     Conjunctiva/sclera: Conjunctivae normal.  Cardiovascular:     Rate and Rhythm: Normal rate and regular rhythm.     Heart sounds: Normal heart sounds.  Pulmonary:     Effort: Pulmonary effort is normal. No respiratory distress.     Breath sounds: Normal breath sounds. No wheezing, rhonchi or rales.  Musculoskeletal:     Cervical back: Neck supple.  Skin:    General: Skin is dry.  Neurological:     General: No focal deficit present.     Mental Status: She is alert. Mental status is at baseline.     Motor: No weakness.     Gait: Gait normal.  Psychiatric:        Mood and Affect: Mood normal.        Behavior: Behavior normal.        Thought Content: Thought content normal.     UC Treatments / Results  Labs (all labs ordered are listed, but only abnormal results are displayed) Labs Reviewed - No data to display  EKG   Radiology No results found.  Procedures Procedures (including critical care time)  Medications Ordered in UC Medications - No data to display  Initial Impression / Assessment and Plan / UC Course  I have reviewed the triage  vital signs and the nursing notes.  Pertinent labs & imaging results that were available during my care of the patient were reviewed by me and considered in my medical decision making (see chart for details).  26 year old female presenting for cough and congestion x1 week.   Child sick with similar symptoms.  Vitals are normal and stable.  She is ill-appearing but nontoxic.  Significant nasal congestion.  Chest clear to auscultation heart regular rate and rhythm.  Advised patient she likely does have the same viral illness as her child.  Supportive care encouraged.  Reassured patient that most viral illnesses get better within a couple of weeks.  Sent Promethazine DM to pharmacy as well as benzonatate to help with cough.  Encouraged increasing fluid intake and also help her get mucus.  Reviewed returning if fever, chest pain, breathing trouble or feeling worse and not better in the next 1 week.  Patient given a work note for the next couple of days.  I did give her a caregiver work note for her child visit last week for 3 days.  Advised patient I cannot cover the time in between.   Final Clinical Impressions(s) / UC Diagnoses   Final diagnoses:  Viral upper respiratory tract infection  Nasal congestion  Acute cough     Discharge Instructions      URI/COLD SYMPTOMS: Your exam today is consistent with a viral illness. Antibiotics are not indicated at this time. Use medications as directed, including cough syrup, nasal saline, and decongestants. Your symptoms should improve over the next few days and resolve within the next 1-2 weeks. Increase rest and fluids. F/u if symptoms worsen or predominate such as sore throat, ear pain, productive cough, shortness of breath, or if you develop high fevers or worsening fatigue over the next several days.       ED Prescriptions     Medication Sig Dispense Auth. Provider   promethazine-dextromethorphan (PROMETHAZINE-DM) 6.25-15 MG/5ML syrup Take 5 mLs by mouth 4 (four) times daily as needed. 118 mL Laurene Footman B, PA-C   benzonatate (TESSALON) 200 MG capsule Take 1 capsule (200 mg total) by mouth 3 (three) times daily as needed for cough. 20 capsule Danton Clap, PA-C      PDMP not reviewed this encounter.   Danton Clap, PA-C 03/02/21 1236    Laurene Footman B, PA-C 03/02/21 1236

## 2021-03-02 NOTE — Discharge Instructions (Addendum)
URI/COLD SYMPTOMS: Your exam today is consistent with a viral illness. Antibiotics are not indicated at this time. Use medications as directed, including cough syrup, nasal saline, and decongestants. Your symptoms should improve over the next few days and resolve within the next 1-2 weeks. Increase rest and fluids. F/u if symptoms worsen or predominate such as sore throat, ear pain, productive cough, shortness of breath, or if you develop high fevers or worsening fatigue over the next several days.

## 2021-03-02 NOTE — ED Triage Notes (Signed)
Pt c/o possible viral infection, temperature of 102.8, congestion, cough x1week   Pt brought her child in for evaluation and was sent to the hospital. Child was diagnosed with Metapneumovirus and she believes she has the same.

## 2023-03-02 ENCOUNTER — Encounter: Payer: Self-pay | Admitting: Nurse Practitioner

## 2023-03-02 ENCOUNTER — Ambulatory Visit: Payer: Medicaid Other

## 2023-03-02 DIAGNOSIS — Z113 Encounter for screening for infections with a predominantly sexual mode of transmission: Secondary | ICD-10-CM | POA: Diagnosis not present

## 2023-03-02 LAB — WET PREP FOR TRICH, YEAST, CLUE
Trichomonas Exam: NEGATIVE
Yeast Exam: NEGATIVE

## 2023-03-02 LAB — HM HIV SCREENING LAB: HM HIV Screening: NEGATIVE

## 2023-03-02 LAB — HM HEPATITIS C SCREENING LAB: HM Hepatitis Screen: NEGATIVE

## 2023-03-02 LAB — HEPATITIS B SURFACE ANTIGEN

## 2023-03-02 NOTE — Progress Notes (Signed)
 [facility] Department STI clinic [address] [place] [address] Main phone: [phone]  STI screening visit  Subjective:  Erin White is a 28 y.o. female being seen today for an STI screening visit. The patient reports they do have symptoms.  Patient reports that they do not desire a pregnancy in the next year.   They reported they are not interested in discussing contraception today.    Patient's last menstrual period was 02/23/2023 (exact date).  Patient has the following medical conditions:  Patient Active Problem List   Diagnosis Date Noted   Supervision of other normal pregnancy, antepartum 12/09/2019    Chief Complaint  Patient presents with   SEXUALLY TRANSMITTED DISEASE    discharge    Patient is a pleasant 28 y.o. female who presents to the office today requesting symptomatic STI testing.  The patient indicates her concern is that she has an increased amount of clear discharge when she is engaging in sexual intercourse.  Patient indicates 1 female partner in the last 2 months. She reports practicing vaginal and oral sex and never uses condoms. Patient indicates a history of Chlamydia about 10 years ago with a negative TOC. Patient reports last sex was 1 week ago. She indicates no contraceptive use. Patient indicates LMP was 02/23/23 (7 days ago) and is monthly.     Does the patient using douching products? No  Last HIV test per patient/review of record was  Lab Results  Component Value Date   HMHIVSCREEN Negative - Validated 03/02/2023   No results found for: "HIV"   Last HEPC test per patient/review of record was  Lab Results  Component Value Date   HMHEPCSCREEN Negative-Validated 03/02/2023   No components found for: "HEPC"   Last HEPB test per patient/review of record was No components found for: "HMHEPBSCREEN"   Patient reports last pap was:   No results found for: "DIAGPAP", "HPVHIGH", "ADEQPAP" No results found  for: "SPECADGYN" No Cervical Cancer Screening results to display.  Screening for MPX risk: Does the patient have an unexplained rash? No Is the patient MSM? No Does the patient endorse multiple sex partners or anonymous sex partners? No Did the patient have close or sexual contact with a person diagnosed with MPX? No Has the patient traveled outside the [place] where MPX is endemic? No Is there a high clinical suspicion for MPX-- evidenced by one of the following No  -Unlikely to be chickenpox  -Lymphadenopathy  -Rash that present in same phase of evolution on any given body part See flowsheet for further details and programmatic requirements.   Immunization history:   There is no immunization history on file for this patient.   The following portions of the patient's history were reviewed and updated as appropriate: allergies, current medications, past medical history, past social history, past surgical history and problem list.  Objective:  There were no vitals filed for this visit.  Physical Exam Nursing note reviewed. Chaperone present: Declined chaperone.  Constitutional:      Appearance: Normal appearance.  HENT:     Head: Normocephalic.     Salivary Glands: Right salivary gland is not diffusely enlarged or tender. Left salivary gland is not diffusely enlarged or tender.     Mouth/Throat:     Lips: Pink. No lesions.     Mouth: Mucous membranes are moist.     Tongue: No lesions. Tongue does not deviate from midline.     Pharynx: Oropharynx is clear. Uvula  midline.     Tonsils: No tonsillar exudate.  Eyes:     General:        Right eye: No discharge.        Left eye: No discharge.  Pulmonary:     Effort: Pulmonary effort is normal.  Genitourinary:    General: Normal vulva.     Exam position: Lithotomy position.     Pubic Area: No rash or pubic lice.      Tanner stage (genital): 5.     Labia:        Right: No rash, tenderness, lesion or injury.        Left: No rash,  tenderness, lesion or injury.      Vagina: Normal. No vaginal discharge, erythema, tenderness, bleeding or lesions.     Cervix: Normal. No cervical motion tenderness, discharge, friability, lesion, erythema, cervical bleeding or eversion.     Uterus: Normal.      Adnexa: Right adnexa normal and left adnexa normal.     Comments: pH<4.5 No abnormal vaginal discharge seen on PE.  Lymphadenopathy:     Head:     Right side of head: No submental, submandibular, tonsillar, preauricular or posterior auricular adenopathy.     Left side of head: No submental, submandibular, tonsillar, preauricular or posterior auricular adenopathy.     Cervical: No cervical adenopathy.     Right cervical: No superficial or posterior cervical adenopathy.    Left cervical: No superficial or posterior cervical adenopathy.     Upper Body:     Right upper body: No supraclavicular or axillary adenopathy.     Left upper body: No supraclavicular or axillary adenopathy.     Lower Body: No right inguinal adenopathy. No left inguinal adenopathy.  Skin:    General: Skin is warm and dry.     Findings: No lesion or rash.     Comments: Skin tone appropriate for ethnicity.   Neurological:     Mental Status: She is alert and oriented to person, place, and time.  Psychiatric:        Attention and Perception: Attention and perception normal.        Mood and Affect: Mood and affect normal.        Speech: Speech normal.        Behavior: Behavior normal. Behavior is cooperative.        Thought Content: Thought content normal.     Assessment and Plan:  Erin White is a 28 y.o. female presenting to the [facility] Department for STI screening  1. Screening for venereal disease (Primary)  - Chlamydia/Gonorrhea [facility] - HBV Antigen/Antibody [facility] - HIV/HCV [place] [facility] - Syphilis Serology, [facility] - Gonococcus culture - WET PREP FOR TRICH, YEAST, CLUE  Patient accepted all screenings including  oral, vaginal CT/GC and bloodwork for HIV/RPR, and wet prep. Patient meets criteria for HepB screening? Yes. Ordered? yes Patient meets criteria for HepC screening? Yes. Ordered? yes  Treat wet prep per standing order Discussed time line for [facility] results and that patient will be called with positive results and encouraged patient to call if she had not heard in 2 weeks.  Counseled to return or seek care for continued or worsening symptoms Recommended repeat testing in 3 months with positive results. Recommended condom use with all sex for STI prevention.   Patient is currently using  nothing  to prevent pregnancy.    Return if symptoms worsen or  fail to improve.  No future appointments.  Total time with patient 30 minutes.   Edmonia James, NP

## 2023-03-02 NOTE — Progress Notes (Signed)
 Pt is here for std screening.  Wet prep results reviewed with pt, no treatment per standing order. Condoms declined. Gaspar Garbe, RN

## 2023-03-06 ENCOUNTER — Telehealth: Payer: Self-pay | Admitting: Licensed Clinical Social Worker

## 2023-03-06 LAB — GONOCOCCUS CULTURE

## 2023-03-06 NOTE — Telephone Encounter (Signed)
 Patient lvm for LCSW on 03/03/23. LCSW returned call today and spoke with patient about virtual services. LCSW instructed patient to come in to [facility] to sign [facility] consent and call to schedule initial appointment. Patient in agreement with this plan.
